# Patient Record
Sex: Female | Born: 2000 | Race: Black or African American | Hispanic: No | Marital: Single
Health system: Southern US, Community
[De-identification: ages and names within clinical notes are randomized; demographics above are authoritative.]

---
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
## 2018-01-23 DIAGNOSIS — N72 Inflammatory disease of cervix uteri: Secondary | ICD-10-CM | POA: Diagnosis not present

## 2018-01-30 ENCOUNTER — Encounter: Payer: Self-pay | Admitting: Obstetrics and Gynecology

## 2018-08-20 DIAGNOSIS — Z3042 Encounter for surveillance of injectable contraceptive: Secondary | ICD-10-CM | POA: Diagnosis not present

## 2018-09-15 DIAGNOSIS — Z3169 Encounter for other general counseling and advice on procreation: Secondary | ICD-10-CM | POA: Diagnosis not present

## 2018-09-15 DIAGNOSIS — Z01419 Encounter for gynecological examination (general) (routine) without abnormal findings: Secondary | ICD-10-CM | POA: Diagnosis not present
